# Patient Record
Sex: Female | Born: 1950 | Hispanic: Yes | Marital: Single | Smoking: Never smoker
Health system: Southern US, Community
[De-identification: ages and names within clinical notes are randomized; demographics above are authoritative.]

## PROBLEM LIST (undated history)

## (undated) DIAGNOSIS — I10 Essential (primary) hypertension: Secondary | ICD-10-CM

---

## 2015-04-17 ENCOUNTER — Emergency Department: Payer: No Typology Code available for payment source

## 2015-04-17 ENCOUNTER — Encounter: Payer: Self-pay | Admitting: Emergency Medicine

## 2015-04-17 ENCOUNTER — Emergency Department
Admission: EM | Admit: 2015-04-17 | Discharge: 2015-04-17 | Disposition: A | Discharge status: Home / Self Care | Payer: No Typology Code available for payment source | Attending: Emergency Medicine | Admitting: Emergency Medicine

## 2015-04-17 DIAGNOSIS — Y9389 Activity, other specified: Secondary | ICD-10-CM | POA: Insufficient documentation

## 2015-04-17 DIAGNOSIS — S0990XA Unspecified injury of head, initial encounter: Secondary | ICD-10-CM | POA: Insufficient documentation

## 2015-04-17 DIAGNOSIS — S161XXA Strain of muscle, fascia and tendon at neck level, initial encounter: Secondary | ICD-10-CM | POA: Insufficient documentation

## 2015-04-17 DIAGNOSIS — S46911A Strain of unspecified muscle, fascia and tendon at shoulder and upper arm level, right arm, initial encounter: Secondary | ICD-10-CM

## 2015-04-17 DIAGNOSIS — I10 Essential (primary) hypertension: Secondary | ICD-10-CM | POA: Diagnosis not present

## 2015-04-17 DIAGNOSIS — Y998 Other external cause status: Secondary | ICD-10-CM | POA: Insufficient documentation

## 2015-04-17 DIAGNOSIS — Y92410 Unspecified street and highway as the place of occurrence of the external cause: Secondary | ICD-10-CM | POA: Insufficient documentation

## 2015-04-17 DIAGNOSIS — S199XXA Unspecified injury of neck, initial encounter: Secondary | ICD-10-CM | POA: Diagnosis present

## 2015-04-17 HISTORY — DX: Essential (primary) hypertension: I10

## 2015-04-17 MED ORDER — IBUPROFEN 800 MG PO TABS: 800.0000 mg | ORAL_TABLET | Freq: Three times a day (TID) | ORAL | Status: AC | PRN

## 2015-04-17 MED ORDER — CYCLOBENZAPRINE HCL 10 MG PO TABS: 10.0000 mg | ORAL_TABLET | Freq: Three times a day (TID) | ORAL | Status: AC | PRN
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
